# Patient Record
Sex: Male | Born: 1950 | Race: White | Hispanic: No | Smoking: Current every day smoker
Health system: Southern US, Community
[De-identification: ages and names within clinical notes are randomized; demographics above are authoritative.]

## PROBLEM LIST (undated history)

## (undated) DIAGNOSIS — M549 Dorsalgia, unspecified: Secondary | ICD-10-CM

## (undated) HISTORY — PX: BACK SURGERY: SHX140

## (undated) HISTORY — PX: CHOLECYSTECTOMY: SHX55

---

## 2012-06-27 ENCOUNTER — Emergency Department (HOSPITAL_COMMUNITY)
Admission: EM | Admit: 2012-06-27 | Discharge: 2012-06-27 | Disposition: A | Discharge status: Home / Self Care | Payer: Worker's Compensation | Attending: Emergency Medicine | Admitting: Emergency Medicine

## 2012-06-27 ENCOUNTER — Encounter (HOSPITAL_COMMUNITY): Payer: Self-pay | Admitting: Physical Medicine and Rehabilitation

## 2012-06-27 DIAGNOSIS — M545 Low back pain, unspecified: Secondary | ICD-10-CM | POA: Insufficient documentation

## 2012-06-27 DIAGNOSIS — Z981 Arthrodesis status: Secondary | ICD-10-CM | POA: Insufficient documentation

## 2012-06-27 DIAGNOSIS — G8929 Other chronic pain: Secondary | ICD-10-CM | POA: Insufficient documentation

## 2012-06-27 DIAGNOSIS — F17200 Nicotine dependence, unspecified, uncomplicated: Secondary | ICD-10-CM | POA: Insufficient documentation

## 2012-06-27 HISTORY — DX: Dorsalgia, unspecified: M54.9

## 2012-06-27 NOTE — ED Provider Notes (Signed)
Medical screening examination/treatment/procedure(s) were performed by non-physician practitioner and as supervising physician I was immediately available for consultation/collaboration.   Charles B. Bernette Mayers, MD 06/27/12 [phone]

## 2012-06-27 NOTE — ED Notes (Signed)
Pt presents to department for evaluation of chronic lower back pain. Ongoing since back surgery 2010. Pt states no relief with pain medications at home. Does not have PCP at the time. He is alert and oriented x4. 7./10 pain at the time. Ambulatory to triage.
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
## 2012-06-27 NOTE — ED Notes (Signed)
Patient reports his legs will give out on him at times.  He is taking lortab for pain and they are not effecitve

## 2013-11-22 ENCOUNTER — Encounter (HOSPITAL_COMMUNITY): Payer: Self-pay | Admitting: Emergency Medicine

## 2013-11-22 ENCOUNTER — Emergency Department (HOSPITAL_COMMUNITY)
Admission: EM | Admit: 2013-11-22 | Discharge: 2013-11-22 | Disposition: A | Discharge status: Home / Self Care | Payer: Medicaid Other | Attending: Emergency Medicine | Admitting: Emergency Medicine

## 2013-11-22 ENCOUNTER — Emergency Department (HOSPITAL_COMMUNITY): Payer: Medicaid Other

## 2013-11-22 ENCOUNTER — Encounter (HOSPITAL_COMMUNITY): Payer: Self-pay | Admitting: Physical Medicine and Rehabilitation

## 2013-11-22 DIAGNOSIS — F17200 Nicotine dependence, unspecified, uncomplicated: Secondary | ICD-10-CM | POA: Insufficient documentation

## 2013-11-22 DIAGNOSIS — Y92410 Unspecified street and highway as the place of occurrence of the external cause: Secondary | ICD-10-CM | POA: Insufficient documentation

## 2013-11-22 DIAGNOSIS — S139XXA Sprain of joints and ligaments of unspecified parts of neck, initial encounter: Secondary | ICD-10-CM | POA: Insufficient documentation

## 2013-11-22 DIAGNOSIS — S161XXA Strain of muscle, fascia and tendon at neck level, initial encounter: Secondary | ICD-10-CM

## 2013-11-22 DIAGNOSIS — Y9389 Activity, other specified: Secondary | ICD-10-CM | POA: Insufficient documentation

## 2013-11-22 DIAGNOSIS — Z79899 Other long term (current) drug therapy: Secondary | ICD-10-CM | POA: Insufficient documentation

## 2013-11-22 DIAGNOSIS — G8929 Other chronic pain: Secondary | ICD-10-CM | POA: Insufficient documentation

## 2013-11-22 DIAGNOSIS — Z8739 Personal history of other diseases of the musculoskeletal system and connective tissue: Secondary | ICD-10-CM | POA: Insufficient documentation

## 2013-11-22 MED ORDER — MELOXICAM 7.5 MG PO TABS: 7.5000 mg | ORAL_TABLET | Freq: Every day | ORAL | Status: AC

## 2013-11-22 MED ORDER — TRAMADOL HCL 50 MG PO TABS
50.0000 mg | ORAL_TABLET | Freq: Once | ORAL | Status: AC
  Administered 2013-11-22: 50 mg via ORAL
  Filled 2013-11-22: qty 1

## 2013-11-22 MED ORDER — CYCLOBENZAPRINE HCL 10 MG PO TABS: 10.0000 mg | ORAL_TABLET | Freq: Two times a day (BID) | ORAL | Status: AC | PRN
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
## 2013-11-22 NOTE — ED Notes (Signed)
Bed: [facility] Expected date:  Expected time:  Means of arrival:  Comments: EMS  
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
